# Patient Record
Sex: Female | Born: 1960 | Race: Black or African American | Hispanic: No | Marital: Married | Smoking: Never smoker
Health system: Southern US, Community
[De-identification: ages and names within clinical notes are randomized; demographics above are authoritative.]

## PROBLEM LIST (undated history)

## (undated) DIAGNOSIS — E079 Disorder of thyroid, unspecified: Secondary | ICD-10-CM

## (undated) DIAGNOSIS — I10 Essential (primary) hypertension: Secondary | ICD-10-CM

## (undated) DIAGNOSIS — T7840XA Allergy, unspecified, initial encounter: Secondary | ICD-10-CM

## (undated) HISTORY — PX: ABDOMINAL HYSTERECTOMY: SHX81

## (undated) HISTORY — DX: Allergy, unspecified, initial encounter: T78.40XA

---

## 1998-06-16 ENCOUNTER — Other Ambulatory Visit: Admission: RE | Admit: 1998-06-16 | Discharge: 1998-06-16 | Payer: Self-pay | Admitting: Gynecology

## 2000-01-03 ENCOUNTER — Other Ambulatory Visit: Admission: RE | Admit: 2000-01-03 | Discharge: 2000-01-03 | Payer: Self-pay | Admitting: Gynecology

## 2001-06-25 ENCOUNTER — Other Ambulatory Visit: Admission: RE | Admit: 2001-06-25 | Discharge: 2001-06-25 | Payer: Self-pay | Admitting: Gynecology

## 2002-08-20 ENCOUNTER — Other Ambulatory Visit: Admission: RE | Admit: 2002-08-20 | Discharge: 2002-08-20 | Payer: Self-pay | Admitting: Gynecology

## 2002-09-16 ENCOUNTER — Inpatient Hospital Stay (HOSPITAL_COMMUNITY): Admission: RE | Admit: 2002-09-16 | Discharge: 2002-09-18 | Payer: Self-pay | Admitting: Gynecology

## 2002-10-14 ENCOUNTER — Encounter: Payer: Self-pay | Admitting: Obstetrics and Gynecology

## 2002-10-14 ENCOUNTER — Inpatient Hospital Stay (HOSPITAL_COMMUNITY): Admission: AD | Admit: 2002-10-14 | Discharge: 2002-10-20 | Payer: Self-pay | Admitting: Gynecology

## 2002-10-17 ENCOUNTER — Encounter: Payer: Self-pay | Admitting: Gynecology

## 2014-12-07 ENCOUNTER — Emergency Department (HOSPITAL_COMMUNITY): Payer: No Typology Code available for payment source

## 2014-12-07 ENCOUNTER — Inpatient Hospital Stay (HOSPITAL_COMMUNITY): Payer: No Typology Code available for payment source

## 2014-12-07 ENCOUNTER — Encounter (HOSPITAL_COMMUNITY): Payer: Self-pay | Admitting: Emergency Medicine

## 2014-12-07 ENCOUNTER — Inpatient Hospital Stay (HOSPITAL_COMMUNITY)
Admission: EM | Admit: 2014-12-07 | Discharge: 2014-12-08 | DRG: 103 | Disposition: A | Discharge status: Home / Self Care | Payer: No Typology Code available for payment source | Attending: Internal Medicine | Admitting: Internal Medicine

## 2014-12-07 DIAGNOSIS — Z888 Allergy status to other drugs, medicaments and biological substances status: Secondary | ICD-10-CM

## 2014-12-07 DIAGNOSIS — R519 Headache, unspecified: Secondary | ICD-10-CM | POA: Diagnosis present

## 2014-12-07 DIAGNOSIS — R9431 Abnormal electrocardiogram [ECG] [EKG]: Secondary | ICD-10-CM

## 2014-12-07 DIAGNOSIS — R55 Syncope and collapse: Secondary | ICD-10-CM | POA: Diagnosis not present

## 2014-12-07 DIAGNOSIS — R51 Headache: Secondary | ICD-10-CM | POA: Diagnosis not present

## 2014-12-07 DIAGNOSIS — Q248 Other specified congenital malformations of heart: Secondary | ICD-10-CM

## 2014-12-07 DIAGNOSIS — Z7982 Long term (current) use of aspirin: Secondary | ICD-10-CM

## 2014-12-07 DIAGNOSIS — E039 Hypothyroidism, unspecified: Secondary | ICD-10-CM | POA: Diagnosis present

## 2014-12-07 DIAGNOSIS — I10 Essential (primary) hypertension: Secondary | ICD-10-CM | POA: Diagnosis present

## 2014-12-07 DIAGNOSIS — I498 Other specified cardiac arrhythmias: Secondary | ICD-10-CM

## 2014-12-07 HISTORY — DX: Disorder of thyroid, unspecified: E07.9

## 2014-12-07 HISTORY — DX: Essential (primary) hypertension: I10

## 2014-12-07 LAB — CBC WITH DIFFERENTIAL/PLATELET
BASOS ABS: 0 10*3/uL (ref 0.0–0.1)
BASOS PCT: 0 % (ref 0–1)
EOS PCT: 4 % (ref 0–5)
Eosinophils Absolute: 0.2 10*3/uL (ref 0.0–0.7)
HEMATOCRIT: 38 % (ref 36.0–46.0)
Hemoglobin: 12.5 g/dL (ref 12.0–15.0)
Lymphocytes Relative: 34 % (ref 12–46)
Lymphs Abs: 1.9 10*3/uL (ref 0.7–4.0)
MCH: 22.7 pg — ABNORMAL LOW (ref 26.0–34.0)
MCHC: 32.9 g/dL (ref 30.0–36.0)
MCV: 69 fL — ABNORMAL LOW (ref 78.0–100.0)
MONO ABS: 0.3 10*3/uL (ref 0.1–1.0)
MONOS PCT: 6 % (ref 3–12)
Neutro Abs: 3.2 10*3/uL (ref 1.7–7.7)
Neutrophils Relative %: 56 % (ref 43–77)
Platelets: 255 10*3/uL (ref 150–400)
RBC: 5.51 MIL/uL — AB (ref 3.87–5.11)
RDW: 15 % (ref 11.5–15.5)
WBC: 5.6 10*3/uL (ref 4.0–10.5)

## 2014-12-07 LAB — I-STAT CHEM 8, ED
BUN: 14 mg/dL (ref 6–23)
CALCIUM ION: 1.18 mmol/L (ref 1.12–1.23)
CHLORIDE: 102 meq/L (ref 96–112)
CREATININE: 0.8 mg/dL (ref 0.50–1.10)
GLUCOSE: 87 mg/dL (ref 70–99)
HCT: 43 % (ref 36.0–46.0)
HEMOGLOBIN: 14.6 g/dL (ref 12.0–15.0)
Potassium: 4.2 mmol/L (ref 3.5–5.1)
Sodium: 138 mmol/L (ref 135–145)
TCO2: 24 mmol/L (ref 0–100)

## 2014-12-07 LAB — TROPONIN I

## 2014-12-07 LAB — I-STAT TROPONIN, ED: TROPONIN I, POC: 0 ng/mL (ref 0.00–0.08)

## 2014-12-07 MED ORDER — SODIUM CHLORIDE 0.9 % IV SOLN: 250.0000 mL | INTRAVENOUS | Status: DC | PRN

## 2014-12-07 MED ORDER — DIPHENHYDRAMINE HCL 50 MG/ML IJ SOLN
25.0000 mg | Freq: Once | INTRAMUSCULAR | Status: AC
  Administered 2014-12-07: 25 mg via INTRAVENOUS
  Filled 2014-12-07: qty 1

## 2014-12-07 MED ORDER — SODIUM CHLORIDE 0.9 % IJ SOLN: 3.0000 mL | Freq: Two times a day (BID) | INTRAMUSCULAR | Status: DC

## 2014-12-07 MED ORDER — AMLODIPINE BESYLATE 5 MG PO TABS
5.0000 mg | ORAL_TABLET | Freq: Every day | ORAL | Status: DC
  Administered 2014-12-07 – 2014-12-08 (×2): 5 mg via ORAL
  Filled 2014-12-07 (×2): qty 1

## 2014-12-07 MED ORDER — ALBUTEROL SULFATE (2.5 MG/3ML) 0.083% IN NEBU
2.5000 mg | INHALATION_SOLUTION | RESPIRATORY_TRACT | Status: DC | PRN
Start: 2014-12-07 — End: 2014-12-08

## 2014-12-07 MED ORDER — SODIUM CHLORIDE 0.9 % IJ SOLN
3.0000 mL | INTRAMUSCULAR | Status: DC | PRN
Start: 2014-12-07 — End: 2014-12-08

## 2014-12-07 MED ORDER — OXYCODONE HCL 5 MG PO TABS: 5.0000 mg | ORAL_TABLET | ORAL | Status: DC | PRN

## 2014-12-07 MED ORDER — DIPHENHYDRAMINE HCL 50 MG/ML IJ SOLN: 25.0000 mg | Freq: Three times a day (TID) | INTRAMUSCULAR | Status: DC | PRN

## 2014-12-07 MED ORDER — KETOROLAC TROMETHAMINE 30 MG/ML IJ SOLN
30.0000 mg | Freq: Three times a day (TID) | INTRAMUSCULAR | Status: DC | PRN
  Filled 2014-12-07: qty 1

## 2014-12-07 MED ORDER — ONDANSETRON HCL 4 MG/2ML IJ SOLN: 4.0000 mg | Freq: Four times a day (QID) | INTRAMUSCULAR | Status: DC | PRN

## 2014-12-07 MED ORDER — METOCLOPRAMIDE HCL 5 MG/ML IJ SOLN
10.0000 mg | Freq: Once | INTRAMUSCULAR | Status: AC
  Administered 2014-12-07: 10 mg via INTRAVENOUS
  Filled 2014-12-07: qty 2

## 2014-12-07 MED ORDER — ONDANSETRON HCL 4 MG PO TABS: 4.0000 mg | ORAL_TABLET | Freq: Four times a day (QID) | ORAL | Status: DC | PRN

## 2014-12-07 MED ORDER — LEVOTHYROXINE SODIUM 50 MCG PO TABS
50.0000 ug | ORAL_TABLET | Freq: Every day | ORAL | Status: DC
  Administered 2014-12-08: 50 ug via ORAL
  Filled 2014-12-07 (×2): qty 1

## 2014-12-07 MED ORDER — HYDROCHLOROTHIAZIDE 12.5 MG PO CAPS
12.5000 mg | ORAL_CAPSULE | Freq: Every day | ORAL | Status: DC
  Administered 2014-12-08: 12.5 mg via ORAL
  Filled 2014-12-07: qty 1

## 2014-12-07 MED ORDER — CYCLOBENZAPRINE HCL 10 MG PO TABS: 5.0000 mg | ORAL_TABLET | Freq: Three times a day (TID) | ORAL | Status: DC | PRN

## 2014-12-07 MED ORDER — ACETAMINOPHEN 650 MG RE SUPP: 650.0000 mg | Freq: Four times a day (QID) | RECTAL | Status: DC | PRN

## 2014-12-07 MED ORDER — GUAIFENESIN-DM 100-10 MG/5ML PO SYRP
5.0000 mL | ORAL_SOLUTION | ORAL | Status: DC | PRN
Start: 2014-12-07 — End: 2014-12-08

## 2014-12-07 MED ORDER — METOCLOPRAMIDE HCL 5 MG/ML IJ SOLN
10.0000 mg | Freq: Three times a day (TID) | INTRAMUSCULAR | Status: DC | PRN
  Filled 2014-12-07: qty 2

## 2014-12-07 MED ORDER — SODIUM CHLORIDE 0.9 % IJ SOLN
3.0000 mL | Freq: Two times a day (BID) | INTRAMUSCULAR | Status: DC
  Administered 2014-12-07 – 2014-12-08 (×2): 3 mL via INTRAVENOUS

## 2014-12-07 MED ORDER — ACETAMINOPHEN 325 MG PO TABS: 650.0000 mg | ORAL_TABLET | Freq: Four times a day (QID) | ORAL | Status: DC | PRN

## 2014-12-07 MED ORDER — MORPHINE SULFATE 2 MG/ML IJ SOLN: 1.0000 mg | INTRAMUSCULAR | Status: DC | PRN

## 2014-12-07 MED ORDER — ENOXAPARIN SODIUM 40 MG/0.4ML ~~LOC~~ SOLN
40.0000 mg | SUBCUTANEOUS | Status: DC
  Administered 2014-12-07: 40 mg via SUBCUTANEOUS
  Filled 2014-12-07 (×2): qty 0.4
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
## 2014-12-07 NOTE — ED Notes (Signed)
Per EMS, pt coming in today to be evaluated for near syncope at church today. Pt reports that she has had a headache since 2am. Pt received 324 of aspirin in route. Pts EKG is reading septal STEMI for EMS, EMS spoke with Dr. Tanna Savoy in route about changes, No Code stemi called at that time.
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
## 2014-12-07 NOTE — ED Notes (Signed)
Pt currently in CT.

## 2014-12-08 DIAGNOSIS — I517 Cardiomegaly: Secondary | ICD-10-CM

## 2014-12-08 LAB — BASIC METABOLIC PANEL
ANION GAP: 11 (ref 5–15)
BUN: 15 mg/dL (ref 6–23)
CHLORIDE: 102 meq/L (ref 96–112)
CO2: 23 mmol/L (ref 19–32)
CREATININE: 0.86 mg/dL (ref 0.50–1.10)
Calcium: 9.9 mg/dL (ref 8.4–10.5)
GFR calc Af Amer: 88 mL/min — ABNORMAL LOW (ref >90)
GFR calc non Af Amer: 76 mL/min — ABNORMAL LOW (ref >90)
Glucose, Bld: 108 mg/dL — ABNORMAL HIGH (ref 70–99)
POTASSIUM: 3.7 mmol/L (ref 3.5–5.1)
Sodium: 136 mmol/L (ref 135–145)

## 2014-12-08 LAB — CBC
HCT: 36.7 % (ref 36.0–46.0)
Hemoglobin: 12 g/dL (ref 12.0–15.0)
MCH: 22.6 pg — ABNORMAL LOW (ref 26.0–34.0)
MCHC: 32.7 g/dL (ref 30.0–36.0)
MCV: 69.2 fL — AB (ref 78.0–100.0)
Platelets: 262 10*3/uL (ref 150–400)
RBC: 5.3 MIL/uL — AB (ref 3.87–5.11)
RDW: 14.9 % (ref 11.5–15.5)
WBC: 6.9 10*3/uL (ref 4.0–10.5)

## 2014-12-08 LAB — TROPONIN I: Troponin I: <0.03 ng/mL (ref <0.031)

## 2014-12-08 MED ORDER — FLUTICASONE PROPIONATE 50 MCG/ACT NA SUSP: 2.0000 | Freq: Every day | NASAL | Status: DC

## 2014-12-08 NOTE — Progress Notes (Signed)
Subjective: HA resolved Objective: Current vital signs: BP 136/86 mmHg  Pulse 89  Temp(Src) 98 F (36.7 C) (Oral)  Resp 20  Ht 4\' 11"  (1.499 m)  Wt 70 kg (154 lb 5.2 oz)  BMI 31.15 kg/m2  SpO2 99% Vital signs in last 24 hours: Temp:  [98 F (36.7 C)-98.4 F (36.9 C)] 98 F (36.7 C) (01/04 0619) Pulse Rate:  [68-93] 89 (01/04 0944) Resp:  [17-22] 20 (01/04 0619) BP: (116-150)/(74-105) 136/86 mmHg (01/04 0944) SpO2:  [98 %-100 %] 99 % (01/04 0619) Weight:  [70 kg (154 lb 5.2 oz)-71.215 kg (157 lb)] 70 kg (154 lb 5.2 oz) (01/03 1738)  Intake/Output from previous day:   Intake/Output this shift:   Nutritional status:    Neurologic Exam: General: NAD Mental Status: Alert, oriented, thought content appropriate.  Speech fluent without evidence of aphasia.  Able to follow 3 step commands without difficulty. Cranial Nerves: II: Discs flat bilaterally; Visual fields grossly normal, pupils equal, round, reactive to light and accommodation III,IV, VI: ptosis not present, extra-ocular motions intact bilaterally V,VII: smile symmetric, facial light touch sensation normal bilaterally VIII: hearing normal bilaterally IX,X: gag reflex present XI: bilateral shoulder shrug XII: midline tongue extension without atrophy or fasciculations  Motor: Right : Upper extremity   5/5    Left:     Upper extremity   5/5  Lower extremity   5/5     Lower extremity   5/5 Tone and bulk:normal tone throughout; no atrophy noted Sensory: Pinprick and light touch intact throughout, bilaterally Deep Tendon Reflexes:  Right: Upper Extremity   Left: Upper extremity   biceps (C-5 to C-6) 2/4   biceps (C-5 to C-6) 2/4 tricep (C7) 2/4    triceps (C7) 2/4 Brachioradialis (C6) 2/4  Brachioradialis (C6) 2/4  Lower Extremity Lower Extremity  quadriceps (L-2 to L-4) 2/4   quadriceps (L-2 to L-4) 2/4 Achilles (S1) 1/4   Achilles (S1) 1/4  Plantars: Right: downgoing   Left: downgoing t    Lab  Results: Basic Metabolic Panel:  Recent Labs Lab 12/07/14 1522 12/08/14 0135  NA 138 136  K 4.2 3.7  CL 102 102  CO2  --  23  GLUCOSE 87 108*  BUN 14 15  CREATININE 0.80 0.86  CALCIUM  --  9.9    Liver Function Tests: No results for input(s): AST, ALT, ALKPHOS, BILITOT, PROT, ALBUMIN in the last 168 hours. No results for input(s): LIPASE, AMYLASE in the last 168 hours. No results for input(s): AMMONIA in the last 168 hours.  CBC:  Recent Labs Lab 12/07/14 1500 12/07/14 1522 12/08/14 0135  WBC 5.6  --  6.9  NEUTROABS 3.2  --   --   HGB 12.5 14.6 12.0  HCT 38.0 43.0 36.7  MCV 69.0*  --  69.2*  PLT 255  --  262    Cardiac Enzymes:  Recent Labs Lab 12/07/14 2040 12/08/14 0135 12/08/14 0850  TROPONINI <0.03 <0.03 <0.03    Lipid Panel: No results for input(s): CHOL, TRIG, HDL, CHOLHDL, VLDL, LDLCALC in the last 168 hours.  CBG: No results for input(s): GLUCAP in the last 168 hours.  Microbiology: No results found for this or any previous visit.  Coagulation Studies: No results for input(s): LABPROT, INR in the last 72 hours.  Imaging: Dg Chest 2 View  12/07/2014   CLINICAL DATA:  Single episode of near syncope earlier today wall at church. Headache which began approximately 14 hr ago, now with numbness in  both feet and tingling in both arms. Current history of hypertension.  EXAM: CHEST  2 VIEW  COMPARISON:  None.  FINDINGS: Cardiomediastinal silhouette unremarkable. Lungs clear. Bronchovascular markings normal. Pulmonary vascularity normal. No visible pleural effusions. No pneumothorax. Mild degenerative changes involving the thoracic spine.  IMPRESSION: No acute cardiopulmonary disease.   Electronically Signed   By: Hulan Saas M.D.   On: 12/07/2014 16:05   Ct Head Wo Contrast  12/07/2014   CLINICAL DATA:  Headache and dizziness.  EXAM: CT HEAD WITHOUT CONTRAST  TECHNIQUE: Contiguous axial images were obtained from the base of the skull through the  vertex without intravenous contrast.  COMPARISON:  None.  FINDINGS: No mass lesion. No midline shift. No acute hemorrhage or hematoma. No extra-axial fluid collections. No evidence of acute infarction. Brain parenchyma is normal. Osseous structures are normal.  IMPRESSION: Normal exam.   Electronically Signed   By: Geanie Cooley M.D.   On: 12/07/2014 15:15   Mr Maxine Glenn Head Wo Contrast  12/07/2014   CLINICAL DATA:  Severe incapacitating Ing and throughout and frontal area headaches with mild nausea but no vomiting. Sensitivity to light.  EXAM: MRI HEAD WITHOUT CONTRAST  MRA HEAD WITHOUT CONTRAST  MRA NECK WITHOUT CONTRAST  TECHNIQUE: Multiplanar, multiecho pulse sequences of the brain and surrounding structures were obtained without intravenous contrast. Angiographic images of the Circle of Willis were obtained using MRA technique without intravenous contrast. Angiographic images of the neck were obtained using MRA technique without intravenous contrast. Carotid stenosis measurements (when applicable) are obtained utilizing NASCET criteria, using the distal internal carotid diameter as the denominator.  COMPARISON:  CT head from the same day.  FINDINGS: MRI HEAD FINDINGS  The diffusion-weighted images demonstrate no evidence for acute or subacute infarction. No acute hemorrhage or mass lesion is present. The cerebellar tonsils extend 4 mm below the foramen magnum. Posterior fossa structures are otherwise within normal limits. Midline structures are normal otherwise as well. There is no significant white matter disease.  Flow is present in the major intracranial arteries. Globes and orbits are intact. The paranasal sinuses scratch the mild mucosal thickening is present in the maxillary sinuses and ethmoid air cells. The remaining paranasal sinuses and the mastoid air cells are clear.  MRA HEAD FINDINGS  Internal carotid arteries are within normal limits from the high cervical segments. The A1 and M1 segments are normal.  The anterior communicating artery is patent. ACA and MCA branch vessels are within normal limits.  The left vertebral artery is the dominant vessel. The PICA origins are visualized and normal. The basilar artery is within normal limits. Both posterior cerebral arteries originate from the basilar tip.  MRA NECK FINDINGS  Time-of-flight images demonstrate a standard scratch the time-of-flight images demonstrated 3 vessel arch configuration. The common carotid arteries and bifurcations are normal bilaterally. The internal carotid arteries are normal bilaterally as well.  Both vertebral arteries originate from the subclavian arteries. There is no significant stenosis or flow disturbance within the neck.  IMPRESSION: 1. 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 2. MRI the brain is otherwise normal. 3. Normal variant MRA circle of Willis without evidence for significant proximal stenosis, aneurysm, or branch vessel occlusion. 4. Negative MRA of the neck. 5. Mild bilateral maxillary and ethmoid sinus disease, most significant in the left maxillary sinus.   Electronically Signed   By: Gennette Pac M.D.   On: 12/07/2014 20:30   Mr Angiogram Neck Wo Contrast  12/07/2014  CLINICAL DATA:  Severe incapacitating Ing and throughout and frontal area headaches with mild nausea but no vomiting. Sensitivity to light.  EXAM: MRI HEAD WITHOUT CONTRAST  MRA HEAD WITHOUT CONTRAST  MRA NECK WITHOUT CONTRAST  TECHNIQUE: Multiplanar, multiecho pulse sequences of the brain and surrounding structures were obtained without intravenous contrast. Angiographic images of the Circle of Willis were obtained using MRA technique without intravenous contrast. Angiographic images of the neck were obtained using MRA technique without intravenous contrast. Carotid stenosis measurements (when applicable) are obtained utilizing NASCET criteria, using the distal internal carotid diameter as the denominator.   COMPARISON:  CT head from the same day.  FINDINGS: MRI HEAD FINDINGS  The diffusion-weighted images demonstrate no evidence for acute or subacute infarction. No acute hemorrhage or mass lesion is present. The cerebellar tonsils extend 4 mm below the foramen magnum. Posterior fossa structures are otherwise within normal limits. Midline structures are normal otherwise as well. There is no significant white matter disease.  Flow is present in the major intracranial arteries. Globes and orbits are intact. The paranasal sinuses scratch the mild mucosal thickening is present in the maxillary sinuses and ethmoid air cells. The remaining paranasal sinuses and the mastoid air cells are clear.  MRA HEAD FINDINGS  Internal carotid arteries are within normal limits from the high cervical segments. The A1 and M1 segments are normal. The anterior communicating artery is patent. ACA and MCA branch vessels are within normal limits.  The left vertebral artery is the dominant vessel. The PICA origins are visualized and normal. The basilar artery is within normal limits. Both posterior cerebral arteries originate from the basilar tip.  MRA NECK FINDINGS  Time-of-flight images demonstrate a standard scratch the time-of-flight images demonstrated 3 vessel arch configuration. The common carotid arteries and bifurcations are normal bilaterally. The internal carotid arteries are normal bilaterally as well.  Both vertebral arteries originate from the subclavian arteries. There is no significant stenosis or flow disturbance within the neck.  IMPRESSION: 1. 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 2. MRI the brain is otherwise normal. 3. Normal variant MRA circle of Willis without evidence for significant proximal stenosis, aneurysm, or branch vessel occlusion. 4. Negative MRA of the neck. 5. Mild bilateral maxillary and ethmoid sinus disease, most significant in the left maxillary sinus.   Electronically  Signed   By: Gennette Pac M.D.   On: 12/07/2014 20:30   Mr Brain Wo Contrast  12/07/2014   CLINICAL DATA:  Severe incapacitating Ing and throughout and frontal area headaches with mild nausea but no vomiting. Sensitivity to light.  EXAM: MRI HEAD WITHOUT CONTRAST  MRA HEAD WITHOUT CONTRAST  MRA NECK WITHOUT CONTRAST  TECHNIQUE: Multiplanar, multiecho pulse sequences of the brain and surrounding structures were obtained without intravenous contrast. Angiographic images of the Circle of Willis were obtained using MRA technique without intravenous contrast. Angiographic images of the neck were obtained using MRA technique without intravenous contrast. Carotid stenosis measurements (when applicable) are obtained utilizing NASCET criteria, using the distal internal carotid diameter as the denominator.  COMPARISON:  CT head from the same day.  FINDINGS: MRI HEAD FINDINGS  The diffusion-weighted images demonstrate no evidence for acute or subacute infarction. No acute hemorrhage or mass lesion is present. The cerebellar tonsils extend 4 mm below the foramen magnum. Posterior fossa structures are otherwise within normal limits. Midline structures are normal otherwise as well. There is no significant white matter disease.  Flow is present in the major intracranial  arteries. Globes and orbits are intact. The paranasal sinuses scratch the mild mucosal thickening is present in the maxillary sinuses and ethmoid air cells. The remaining paranasal sinuses and the mastoid air cells are clear.  MRA HEAD FINDINGS  Internal carotid arteries are within normal limits from the high cervical segments. The A1 and M1 segments are normal. The anterior communicating artery is patent. ACA and MCA branch vessels are within normal limits.  The left vertebral artery is the dominant vessel. The PICA origins are visualized and normal. The basilar artery is within normal limits. Both posterior cerebral arteries originate from the basilar tip.   MRA NECK FINDINGS  Time-of-flight images demonstrate a standard scratch the time-of-flight images demonstrated 3 vessel arch configuration. The common carotid arteries and bifurcations are normal bilaterally. The internal carotid arteries are normal bilaterally as well.  Both vertebral arteries originate from the subclavian arteries. There is no significant stenosis or flow disturbance within the neck.  IMPRESSION: 1. 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 2. MRI the brain is otherwise normal. 3. Normal variant MRA circle of Willis without evidence for significant proximal stenosis, aneurysm, or branch vessel occlusion. 4. Negative MRA of the neck. 5. Mild bilateral maxillary and ethmoid sinus disease, most significant in the left maxillary sinus.   Electronically Signed   By: Gennette Pac M.D.   On: 12/07/2014 20:30    Medications:  Scheduled: . amLODipine  5 mg Oral Daily  . enoxaparin (LOVENOX) injection  40 mg Subcutaneous Q24H  . hydrochlorothiazide  12.5 mg Oral Daily  . levothyroxine  50 mcg Oral QAC breakfast  . sodium chloride  3 mL Intravenous Q12H  . sodium chloride  3 mL Intravenous Q12H    Assessment/Plan: HA: Now fully resolved.  MRI/MRA head and neck negative. At this time no further recommendations. Neurology will S/O     Felicie Morn PA-C [facility] [phone]  12/08/2014, 10:04 AM

## 2014-12-08 NOTE — Progress Notes (Signed)
Pt/family given discharge instructions, medication lists, follow up appointments, and when to call the doctor.  Pt/family verbalizes understanding.Pt to follow up with cardiology for event monitor. Thomas Hoff, RN

## 2014-12-08 NOTE — Progress Notes (Signed)
    Subjective:  Denies CP or dyspnea; HA improved   Objective:  Filed Vitals:   12/07/14 2001 12/07/14 2007 12/07/14 2020 12/08/14 0619  BP: 118/74 116/81 132/99 121/77  Pulse: 85 79 93 87  Temp: 98.4 F (36.9 C)   98 F (36.7 C)  TempSrc: Oral   Oral  Resp: Height:      Weight:      SpO2: 98%   99%    Intake/Output from previous day: No intake or output data in the 24 hours ending 12/08/14 0811  Physical Exam: Physical exam: Well-developed well-nourished in no acute distress.  Skin is warm and dry.  HEENT is normal.  Neck is supple.  Chest is clear to auscultation with normal expansion.  Cardiovascular exam is regular rate and rhythm.  Abdominal exam nontender or distended. No masses palpated. Extremities show no edema. neuro grossly intact    Lab Results: Basic Metabolic Panel:  Recent Labs  40/98/11 1522 12/08/14 0135  NA 138 136  K 4.2 3.7  CL 102 102  CO2  --  23  GLUCOSE 87 108*  BUN 14 15  CREATININE 0.80 0.86  CALCIUM  --  9.9   CBC:  Recent Labs  12/07/14 1500 12/07/14 1522 12/08/14 0135  WBC 5.6  --  6.9  NEUTROABS 3.2  --   --   HGB 12.5 14.6 12.0  HCT 38.0 43.0 36.7  MCV 69.0*  --  69.2*  PLT 255  --  262   Cardiac Enzymes:  Recent Labs  12/07/14 2040 12/08/14 0135  TROPONINI <0.03 <0.03     Assessment/Plan:  1 near syncope-patient states this morning she predominantly had a headache yesterday. Question mild dizziness. She did not have syncope or palpitations. Her electrocardiogram does suggest possible Brugada syndrome. However she has never had syncope, palpitations and there is no family history of arrhythmia or sudden death. I therefore do not think she requires further inpatient evaluation. Would arrange outpatient event monitor and then follow-up with Dr. Elease Hashimoto. Note I did review her electrocardiogram and case with Dr. Johney Frame. 2 hypertension-patient's blood pressure has improved. Continue present  medications. 3 HA-per IM We will sign off. Please call with questions.  Olga Millers 12/08/2014, 8:11 AM

## 2014-12-08 NOTE — Discharge Instructions (Signed)
Follow with Primary MD Shelle Iron, MD in 7 days  Please follow with cardiology as an outpatient. Get CBC, CMP, 2 view Chest X ray checked  by Primary MD next visit.    Activity: As tolerated with Full fall precautions use walker/cane & assistance as needed   Disposition Home    Diet: Regular diet , with feeding assistance and aspiration precautions as needed.  For Heart failure patients - Check your Weight same time everyday, if you gain over 2 pounds, or you develop in leg swelling, experience more shortness of breath or chest pain, call your Primary MD immediately. Follow Cardiac Low Salt Diet and 1.8 lit/day fluid restriction.   On your next visit with your primary care physician please Get Medicines reviewed and adjusted.   Please request your Prim.MD to go over all Hospital Tests and Procedure/Radiological results at the follow up, please get all Hospital records sent to your Prim MD by signing hospital release before you go home.   If you experience worsening of your admission symptoms, develop shortness of breath, life threatening emergency, suicidal or homicidal thoughts you must seek medical attention immediately by calling 911 or calling your MD immediately  if symptoms less severe.  You Must read complete instructions/literature along with all the possible adverse reactions/side effects for all the Medicines you take and that have been prescribed to you. Take any new Medicines after you have completely understood and accpet all the possible adverse reactions/side effects.   Do not drive, operating heavy machinery, perform activities at heights, swimming or participation in water activities or provide baby sitting services if your were admitted for syncope or siezures until you have seen by Primary MD or a Neurologist and advised to do so again.  Do not drive when taking Pain medications.    Do not take more than prescribed Pain, Sleep and Anxiety Medications  Special  Instructions: If you have smoked or chewed Tobacco  in the last 2 yrs please stop smoking, stop any regular Alcohol  and or any Recreational drug use.  Wear Seat belts while driving.   Please note  You were cared for by a hospitalist during your hospital stay. If you have any questions about your discharge medications or the care you received while you were in the hospital after you are discharged, you can call the unit and asked to speak with the hospitalist on call if the hospitalist that took care of you is not available. Once you are discharged, your primary care physician will handle any further medical issues. Please note that NO REFILLS for any discharge medications will be authorized once you are discharged, as it is imperative that you return to your primary care physician (or establish a relationship with a primary care physician if you do not have one) for your aftercare needs so that they can reassess your need for medications and monitor your lab values.
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
## 2014-12-08 NOTE — Progress Notes (Signed)
  Echocardiogram 2D Echocardiogram has been performed.  Sandra Harrison 12/08/2014, 4:27 PM

## 2016-07-25 ENCOUNTER — Other Ambulatory Visit: Payer: Self-pay | Admitting: Obstetrics & Gynecology

## 2016-07-25 DIAGNOSIS — R928 Other abnormal and inconclusive findings on diagnostic imaging of breast: Secondary | ICD-10-CM

## 2016-07-26 ENCOUNTER — Other Ambulatory Visit: Payer: Self-pay | Admitting: Obstetrics & Gynecology

## 2016-07-26 DIAGNOSIS — R928 Other abnormal and inconclusive findings on diagnostic imaging of breast: Secondary | ICD-10-CM

## 2016-08-09 ENCOUNTER — Ambulatory Visit
Admission: RE | Admit: 2016-08-09 | Discharge: 2016-08-09 | Disposition: A | Discharge status: Home / Self Care | Payer: Self-pay | Source: Ambulatory Visit | Attending: Obstetrics & Gynecology | Admitting: Obstetrics & Gynecology

## 2016-08-09 DIAGNOSIS — R928 Other abnormal and inconclusive findings on diagnostic imaging of breast: Secondary | ICD-10-CM

## 2017-08-01 ENCOUNTER — Other Ambulatory Visit: Payer: Self-pay | Admitting: Obstetrics & Gynecology

## 2017-08-01 DIAGNOSIS — Z1231 Encounter for screening mammogram for malignant neoplasm of breast: Secondary | ICD-10-CM

## 2017-08-14 ENCOUNTER — Ambulatory Visit
Admission: RE | Admit: 2017-08-14 | Discharge: 2017-08-14 | Disposition: A | Discharge status: Home / Self Care | Source: Ambulatory Visit | Attending: Obstetrics & Gynecology | Admitting: Obstetrics & Gynecology

## 2017-08-14 DIAGNOSIS — Z1231 Encounter for screening mammogram for malignant neoplasm of breast: Secondary | ICD-10-CM

## 2017-09-10 IMAGING — MG 2D DIGITAL SCREENING BILATERAL MAMMOGRAM WITH CAD AND ADJUNCT TO
8 of 12 series · 8 of 28 positions shown · non-contrast
Comparison: Previous exam(s).

CLINICAL DATA: Screening.

EXAM:
2D DIGITAL SCREENING BILATERAL MAMMOGRAM WITH CAD AND ADJUNCT TOMO

[L MLO synth-2D]
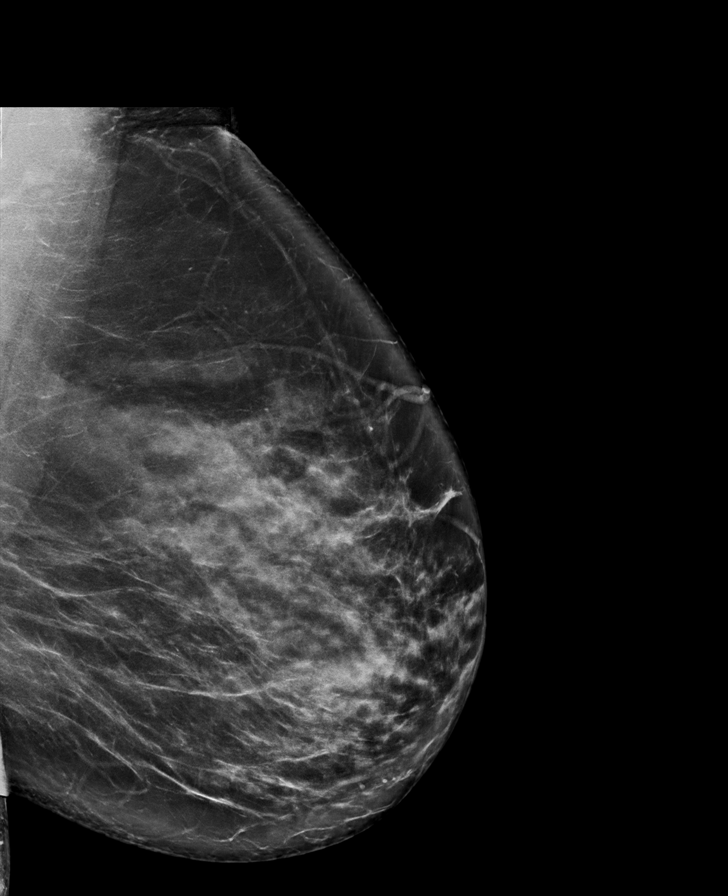

[R CC synth-2D]
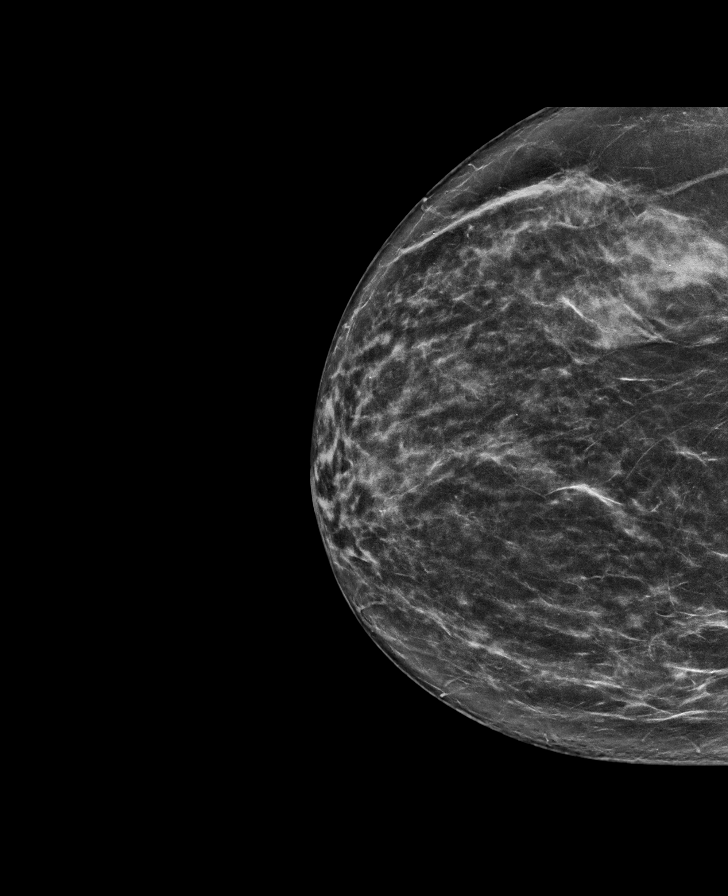

[R MLO synth-2D]
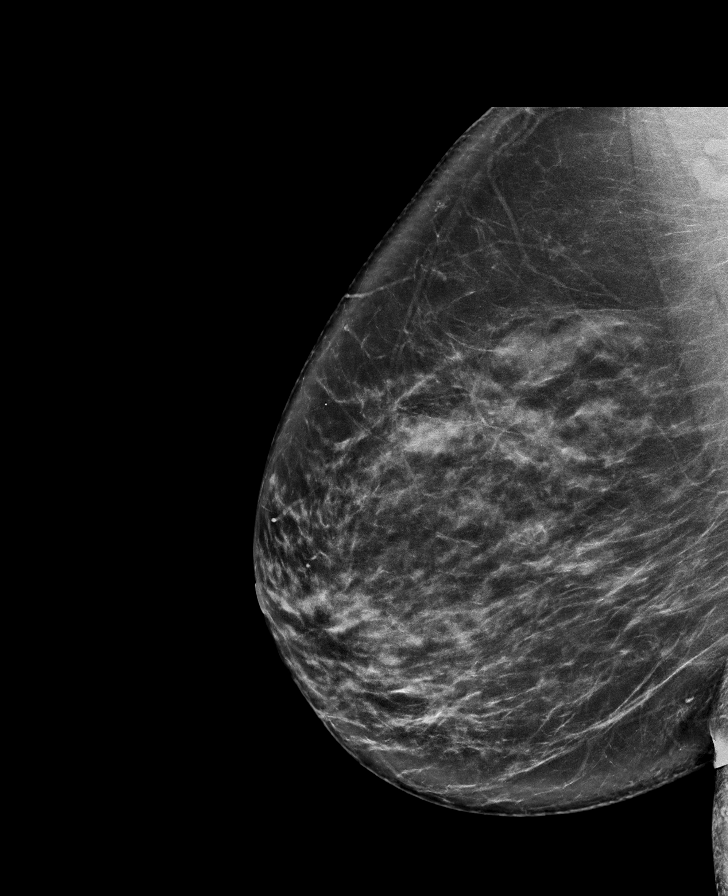

[R MLO]
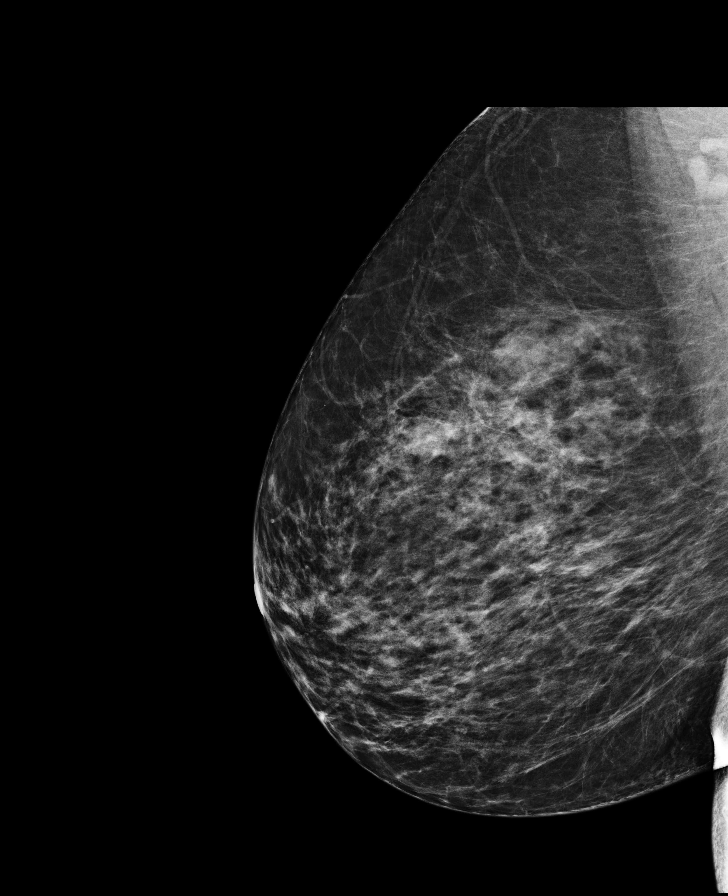

[L CC synth-2D]
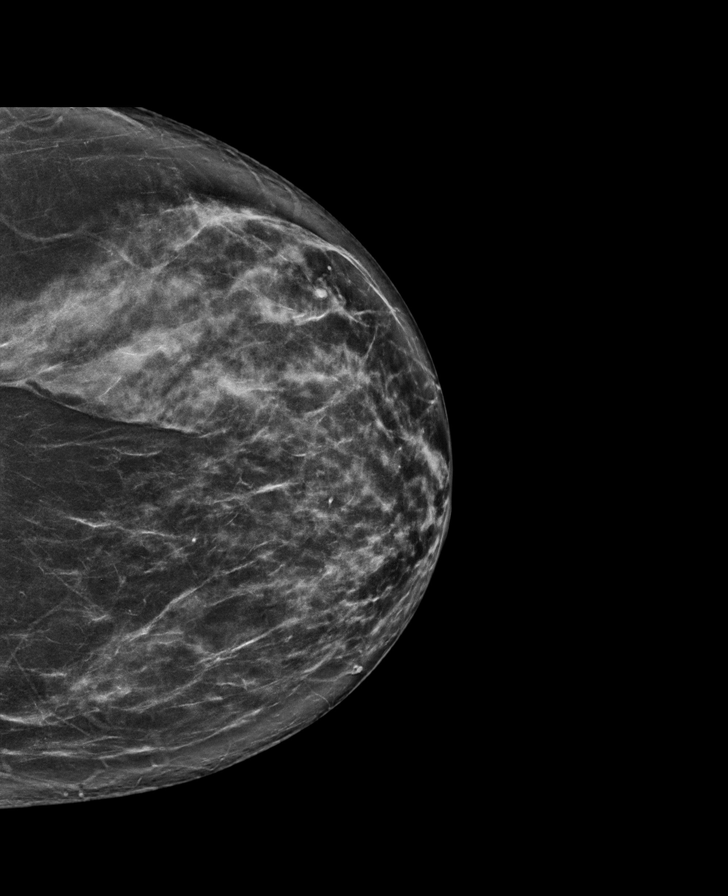

[L CC]
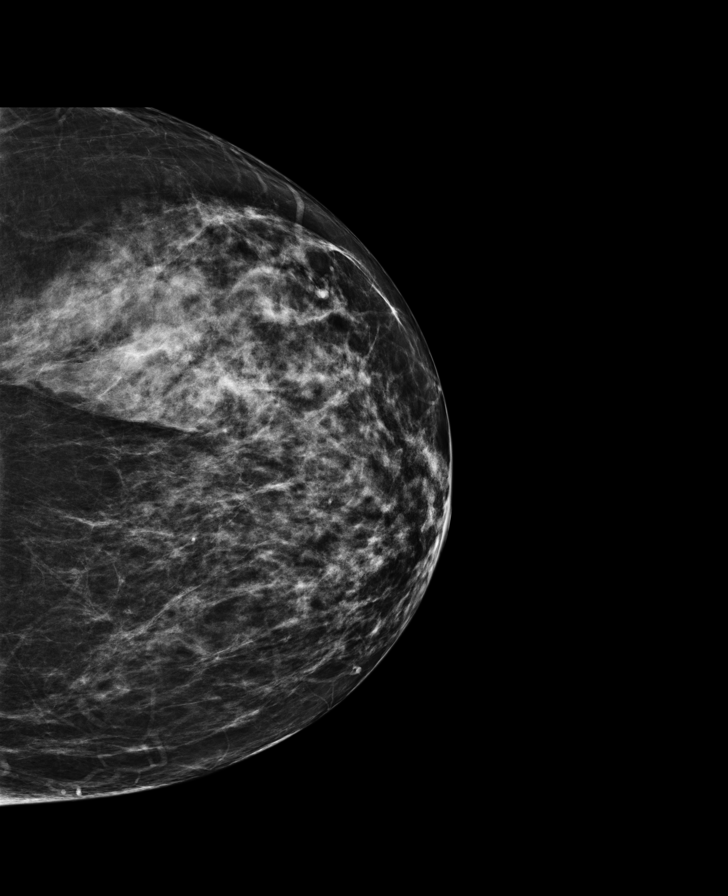

[L MLO]
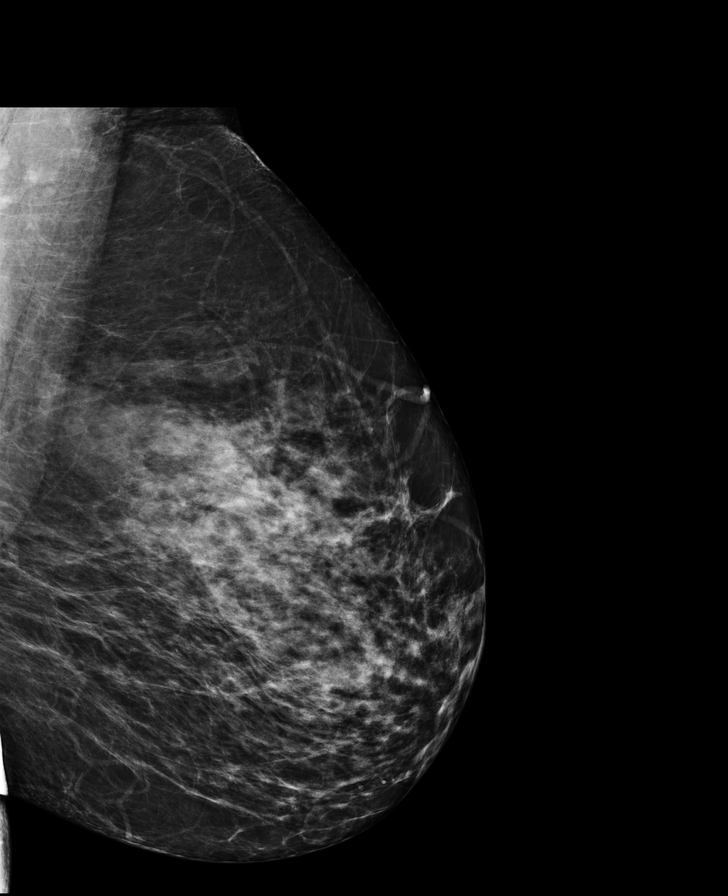

[R CC]
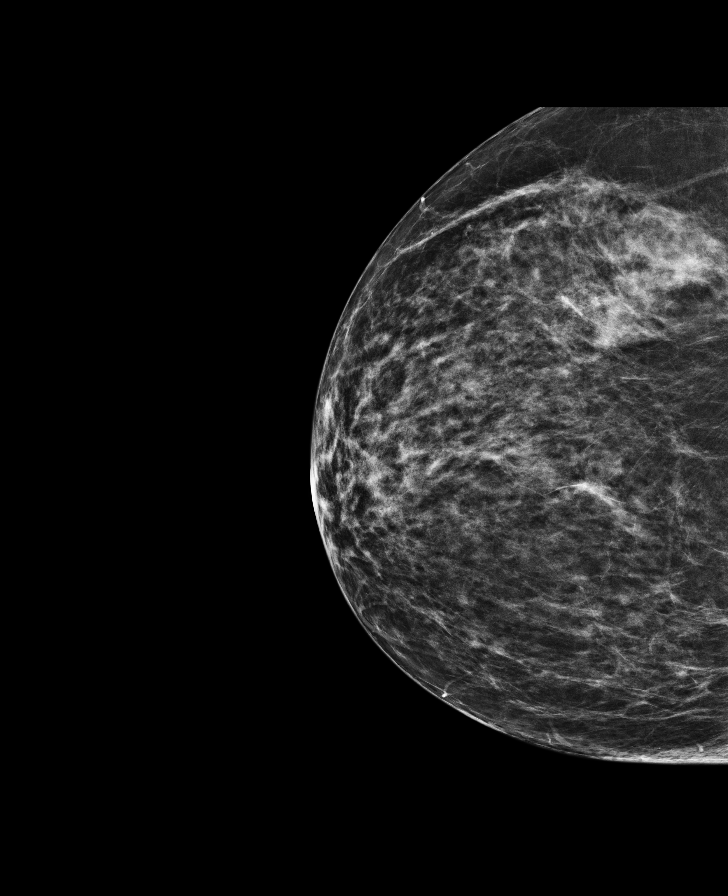

[8 of 28 positions shown; findings below may reference images not displayed]

ACR Breast Density Category c: The breast tissue is heterogeneously
dense, which may obscure small masses.
FINDINGS: There are no findings suspicious for malignancy. Images were
processed with CAD.
IMPRESSION: No mammographic evidence of malignancy. A result letter of this
screening mammogram will be mailed directly to the patient.

RECOMMENDATION:
Screening mammogram in one year. (Code:[id number])

BI-RADS CATEGORY  1: Negative.

## 2018-10-22 ENCOUNTER — Other Ambulatory Visit: Payer: Self-pay | Admitting: Family Medicine

## 2018-10-22 DIAGNOSIS — Z1231 Encounter for screening mammogram for malignant neoplasm of breast: Secondary | ICD-10-CM

## 2018-10-30 ENCOUNTER — Ambulatory Visit
Admission: RE | Admit: 2018-10-30 | Discharge: 2018-10-30 | Disposition: A | Discharge status: Home / Self Care | Source: Ambulatory Visit | Attending: Family Medicine | Admitting: Family Medicine

## 2018-10-30 DIAGNOSIS — Z1231 Encounter for screening mammogram for malignant neoplasm of breast: Secondary | ICD-10-CM

## 2019-12-12 ENCOUNTER — Other Ambulatory Visit: Payer: Self-pay | Admitting: Family Medicine

## 2019-12-12 ENCOUNTER — Other Ambulatory Visit: Payer: Self-pay | Admitting: Obstetrics and Gynecology

## 2019-12-12 DIAGNOSIS — Z1231 Encounter for screening mammogram for malignant neoplasm of breast: Secondary | ICD-10-CM

## 2019-12-31 ENCOUNTER — Other Ambulatory Visit: Payer: Self-pay | Admitting: Family Medicine

## 2019-12-31 DIAGNOSIS — Z780 Asymptomatic menopausal state: Secondary | ICD-10-CM

## 2020-01-21 ENCOUNTER — Other Ambulatory Visit: Payer: Self-pay

## 2020-01-21 ENCOUNTER — Ambulatory Visit
Admission: RE | Admit: 2020-01-21 | Discharge: 2020-01-21 | Disposition: A | Discharge status: Home / Self Care | Source: Ambulatory Visit | Attending: Family Medicine | Admitting: Family Medicine

## 2020-01-21 DIAGNOSIS — Z1231 Encounter for screening mammogram for malignant neoplasm of breast: Secondary | ICD-10-CM

## 2020-03-16 ENCOUNTER — Other Ambulatory Visit: Payer: Self-pay

## 2020-03-16 ENCOUNTER — Ambulatory Visit
Admission: RE | Admit: 2020-03-16 | Discharge: 2020-03-16 | Disposition: A | Discharge status: Home / Self Care | Source: Ambulatory Visit | Attending: Family Medicine | Admitting: Family Medicine

## 2020-03-16 DIAGNOSIS — Z780 Asymptomatic menopausal state: Secondary | ICD-10-CM

## 2020-12-30 ENCOUNTER — Other Ambulatory Visit: Payer: Self-pay | Admitting: Family Medicine

## 2020-12-30 DIAGNOSIS — Z0000 Encounter for general adult medical examination without abnormal findings: Secondary | ICD-10-CM

## 2021-02-15 ENCOUNTER — Other Ambulatory Visit: Payer: Self-pay

## 2021-02-15 ENCOUNTER — Ambulatory Visit
Admission: RE | Admit: 2021-02-15 | Discharge: 2021-02-15 | Disposition: A | Discharge status: Home / Self Care | Payer: TRICARE For Life (TFL) | Source: Ambulatory Visit | Attending: Family Medicine | Admitting: Family Medicine

## 2021-02-15 DIAGNOSIS — Z0000 Encounter for general adult medical examination without abnormal findings: Secondary | ICD-10-CM

## 2022-01-06 ENCOUNTER — Other Ambulatory Visit: Payer: Self-pay | Admitting: Family Medicine

## 2022-01-06 DIAGNOSIS — Z1231 Encounter for screening mammogram for malignant neoplasm of breast: Secondary | ICD-10-CM

## 2022-02-21 ENCOUNTER — Ambulatory Visit
Admission: RE | Admit: 2022-02-21 | Discharge: 2022-02-21 | Disposition: A | Discharge status: Home / Self Care | Source: Ambulatory Visit | Attending: Family Medicine | Admitting: Family Medicine

## 2022-02-21 DIAGNOSIS — Z1231 Encounter for screening mammogram for malignant neoplasm of breast: Secondary | ICD-10-CM

## 2023-06-09 ENCOUNTER — Encounter: Payer: Self-pay | Admitting: Gastroenterology

## 2023-06-23 ENCOUNTER — Other Ambulatory Visit: Payer: Self-pay | Admitting: Family Medicine

## 2023-06-23 DIAGNOSIS — Z1231 Encounter for screening mammogram for malignant neoplasm of breast: Secondary | ICD-10-CM

## 2023-06-27 ENCOUNTER — Ambulatory Visit
Admission: RE | Admit: 2023-06-27 | Discharge: 2023-06-27 | Disposition: A | Discharge status: Home / Self Care | Source: Ambulatory Visit | Attending: Family Medicine | Admitting: Family Medicine

## 2023-06-27 DIAGNOSIS — Z1231 Encounter for screening mammogram for malignant neoplasm of breast: Secondary | ICD-10-CM

## 2023-08-11 ENCOUNTER — Telehealth: Payer: Self-pay | Admitting: *Deleted

## 2023-08-11 NOTE — Telephone Encounter (Signed)
Patient has Brugada syndrome listed under problem list, couldn't find any additional info- if confirmed she has this, I'm assuming she needs to be done at hospital ?  Thanks

## 2023-08-14 NOTE — Telephone Encounter (Signed)
DR Chales Abrahams, I attempted to reach patient to further question her about Brugada syndrome.  Per Cathlyn Parsons she will be need to be done at the hospital. Do you need her to be seen in the office first?

## 2023-08-15 NOTE — Telephone Encounter (Signed)
Inbound call from patient cancelling procedure due to starting a new job. Patient wished to schedule early sometime next year. Will callback when ready .

## 2023-08-15 NOTE — Telephone Encounter (Signed)
Called patient to discuss cancelling colonoscopy at [facility] and rescheduling at the hospital.  Unable to reach patient to cancel pre visit on 08/17/23.  Left message.

## 2023-08-17 ENCOUNTER — Encounter

## 2023-09-05 ENCOUNTER — Encounter: Admitting: Gastroenterology

## 2023-10-09 ENCOUNTER — Telehealth: Payer: Self-pay | Admitting: Gastroenterology

## 2023-10-09 NOTE — Telephone Encounter (Signed)
Inbound call from patient, states she would like to reschedule her colonoscopy, needs to be done at the hospital.

## 2023-10-10 ENCOUNTER — Other Ambulatory Visit: Payer: Self-pay

## 2023-10-10 DIAGNOSIS — I498 Other specified cardiac arrhythmias: Secondary | ICD-10-CM

## 2023-10-10 DIAGNOSIS — Z1211 Encounter for screening for malignant neoplasm of colon: Secondary | ICD-10-CM

## 2023-10-10 NOTE — Telephone Encounter (Signed)
Pt stated that she requested to reschedule her Colonoscopy to be done at the hospital.  Chart reviewed. Pt was scheduled for a previsit appointment on 12/13/2023 at 9:00 AM. Pt made aware. Location provided. Pt was scheduled for a Colonoscopy 12/31/2022 at [facility] on 12/31/2022 at 9:00 AM. Pt made aware. Case ID [id number]. Pt verbalized understanding with all questions answered.

## 2023-12-13 ENCOUNTER — Ambulatory Visit

## 2023-12-13 ENCOUNTER — Telehealth: Payer: Self-pay | Admitting: *Deleted

## 2023-12-13 MED ORDER — NA SULFATE-K SULFATE-MG SULF 17.5-3.13-1.6 GM/177ML PO SOLN: 1.0000 | Freq: Once | ORAL | 0 refills | Status: AC

## 2023-12-13 NOTE — Telephone Encounter (Signed)
 Inbound call from patient returning phone call. Patient has been rescheduled for 1/17 at 2:30 pm for phone call. Patient is requesting a call if there is time available today due to being off of work. Please advise, thank you.

## 2023-12-13 NOTE — Telephone Encounter (Signed)
 Pt was to be an in person PV. Called since no show by 10 after.Unable to reach pt. LM with call back # nd instructions to call and reschedule her PV or procedure will be canceled at end of day per protocal.

## 2023-12-22 ENCOUNTER — Ambulatory Visit (AMBULATORY_SURGERY_CENTER)

## 2023-12-22 ENCOUNTER — Other Ambulatory Visit: Payer: Self-pay

## 2023-12-22 ENCOUNTER — Encounter (HOSPITAL_COMMUNITY): Payer: Self-pay | Admitting: Gastroenterology

## 2023-12-22 VITALS — Ht 59.0 in | Wt 145.0 lb

## 2023-12-22 DIAGNOSIS — Z1211 Encounter for screening for malignant neoplasm of colon: Secondary | ICD-10-CM

## 2023-12-22 MED ORDER — NA SULFATE-K SULFATE-MG SULF 17.5-3.13-1.6 GM/177ML PO SOLN: 1.0000 | Freq: Once | ORAL | 0 refills | Status: AC

## 2023-12-22 NOTE — Progress Notes (Signed)
Denies allergies to eggs or soy products. Denies complication of anesthesia or sedation. Denies use of weight loss medication. Denies use of O2.   Emmi instructions given for colonoscopy.  

## 2024-01-01 ENCOUNTER — Ambulatory Visit (HOSPITAL_COMMUNITY)
Admission: RE | Admit: 2024-01-01 | Discharge: 2024-01-01 | Disposition: A | Discharge status: Home / Self Care | Attending: Gastroenterology | Admitting: Gastroenterology

## 2024-01-01 ENCOUNTER — Encounter (HOSPITAL_COMMUNITY)
Admission: RE | Disposition: A | Discharge status: Home / Self Care | Payer: Self-pay | Source: Home / Self Care | Attending: Gastroenterology

## 2024-01-01 ENCOUNTER — Other Ambulatory Visit: Payer: Self-pay

## 2024-01-01 ENCOUNTER — Encounter (HOSPITAL_COMMUNITY): Payer: Self-pay | Admitting: Gastroenterology

## 2024-01-01 ENCOUNTER — Ambulatory Visit (HOSPITAL_COMMUNITY): Admitting: Anesthesiology

## 2024-01-01 DIAGNOSIS — I10 Essential (primary) hypertension: Secondary | ICD-10-CM | POA: Diagnosis not present

## 2024-01-01 DIAGNOSIS — Z1211 Encounter for screening for malignant neoplasm of colon: Secondary | ICD-10-CM | POA: Diagnosis present

## 2024-01-01 DIAGNOSIS — E039 Hypothyroidism, unspecified: Secondary | ICD-10-CM | POA: Diagnosis not present

## 2024-01-01 DIAGNOSIS — K640 First degree hemorrhoids: Secondary | ICD-10-CM | POA: Diagnosis not present

## 2024-01-01 DIAGNOSIS — K5730 Diverticulosis of large intestine without perforation or abscess without bleeding: Secondary | ICD-10-CM | POA: Diagnosis not present

## 2024-01-01 DIAGNOSIS — D120 Benign neoplasm of cecum: Secondary | ICD-10-CM

## 2024-01-01 DIAGNOSIS — I498 Other specified cardiac arrhythmias: Secondary | ICD-10-CM

## 2024-01-01 HISTORY — PX: COLONOSCOPY WITH PROPOFOL: SHX5780

## 2024-01-01 HISTORY — PX: POLYPECTOMY: SHX5525

## 2024-01-01 SURGERY — COLONOSCOPY WITH PROPOFOL
Anesthesia: Monitor Anesthesia Care

## 2024-01-01 MED ORDER — ASPIRIN EC 81 MG PO TBEC: 81.0000 mg | DELAYED_RELEASE_TABLET | Freq: Four times a day (QID) | ORAL | 11 refills | Status: AC | PRN

## 2024-01-01 MED ORDER — SODIUM CHLORIDE 0.9 % IV SOLN: INTRAVENOUS | Status: DC

## 2024-01-01 MED ORDER — SODIUM CHLORIDE 0.9 % IV SOLN: INTRAVENOUS | Status: DC | PRN

## 2024-01-01 MED ORDER — LIDOCAINE HCL (CARDIAC) PF 100 MG/5ML IV SOSY
PREFILLED_SYRINGE | INTRAVENOUS | Status: DC | PRN
  Administered 2024-01-01: 80 mg via INTRATRACHEAL

## 2024-01-01 MED ORDER — PROPOFOL 10 MG/ML IV BOLUS
INTRAVENOUS | Status: DC | PRN
  Administered 2024-01-01: 60 mg via INTRAVENOUS
  Administered 2024-01-01: 30 mg via INTRAVENOUS

## 2024-01-01 MED ORDER — PROPOFOL 1000 MG/100ML IV EMUL
INTRAVENOUS | Status: AC
  Filled 2024-01-01: qty 100

## 2024-01-01 MED ORDER — PROPOFOL 500 MG/50ML IV EMUL
INTRAVENOUS | Status: DC | PRN
  Administered 2024-01-01: 100 ug/kg/min via INTRAVENOUS

## 2024-01-01 SURGICAL SUPPLY — 21 items
ELECT REM PT RETURN 9FT ADLT (ELECTROSURGICAL) IMPLANT
ELECTRODE REM PT RTRN 9FT ADLT (ELECTROSURGICAL) IMPLANT
FCP BXJMBJMB 240X2.8X (CUTTING FORCEPS)
FLOOR PAD 36X40 (MISCELLANEOUS) ×2 IMPLANT
FORCEPS BIOP RAD 4 LRG CAP 4 (CUTTING FORCEPS) IMPLANT
FORCEPS BIOP RJ4 240 W/NDL (CUTTING FORCEPS) IMPLANT
FORCEPS BXJMBJMB 240X2.8X (CUTTING FORCEPS) IMPLANT
INJECTOR/SNARE I SNARE (MISCELLANEOUS) IMPLANT
LUBRICANT JELLY 4.5OZ STERILE (MISCELLANEOUS) IMPLANT
MANIFOLD NEPTUNE II (INSTRUMENTS) IMPLANT
NDL SCLEROTHERAPY 25GX240 (NEEDLE) IMPLANT
NEEDLE SCLEROTHERAPY 25GX240 (NEEDLE) IMPLANT
PAD FLOOR 36X40 (MISCELLANEOUS) ×2 IMPLANT
PROBE APC STR FIRE (PROBE) IMPLANT
PROBE INJECTION GOLD 7FR (MISCELLANEOUS) IMPLANT
SNARE ROTATE MED OVAL 20MM (MISCELLANEOUS) IMPLANT
SYR 50ML LL SCALE MARK (SYRINGE) IMPLANT
TRAP SPECIMEN MUCOUS 40CC (MISCELLANEOUS) IMPLANT
TUBING ENDO SMARTCAP PENTAX (MISCELLANEOUS) IMPLANT
TUBING IRRIGATION ENDOGATOR (MISCELLANEOUS) ×2 IMPLANT
WATER STERILE IRR 1000ML POUR (IV SOLUTION) IMPLANT

## 2024-01-01 NOTE — Op Note (Signed)
[facility] Patient Name: Sandra Harrison Procedure Date: 01/01/2024 MRN: [id number] Attending MD: Lynann Bologna , MD, [id number] Date of Birth: 1961-10-01 CSN: [id number] Age: 63 Admit Type: Outpatient Procedure:                Colonoscopy Indications:              Screening for colorectal malignant neoplasm Providers:                Lynann Bologna, MD, Doristine Mango, RN, Alan Ripper,                            Technician Referring MD:              Medicines:                Monitored Anesthesia Care Complications:            No immediate complications. Estimated Blood Loss:     Estimated blood loss: none. Procedure:                Pre-Anesthesia Assessment:                           - Prior to the procedure, a History and Physical                            was performed, and patient medications and                            allergies were reviewed. The patient's tolerance of                            previous anesthesia was also reviewed. The risks                            and benefits of the procedure and the sedation                            options and risks were discussed with the patient.                            All questions were answered, and informed consent                            was obtained. Prior Anticoagulants: The patient has                            taken no anticoagulant or antiplatelet agents. ASA                            Grade Assessment: II - A patient with mild systemic                            disease. After reviewing the risks and benefits,  the patient was deemed in satisfactory condition to                            undergo the procedure.                           After obtaining informed consent, the colonoscope                            was passed under direct vision. Throughout the                            procedure, the patient's blood pressure, pulse, and                            oxygen  saturations were monitored continuously. The                            [id number]-HQ190L ([id number]) Olympus colonoscope was                            introduced through the anus and advanced to the 2                            cm into the ileum. The colonoscopy was performed                            without difficulty. The patient tolerated the                            procedure well. The quality of the bowel                            preparation was good. The terminal ileum, ileocecal                            valve, appendiceal orifice, and rectum were                            photographed. Scope In: 9:10:58 AM Scope Out: 9:26:18 AM Scope Withdrawal Time: 0 hours 11 minutes 2 seconds  Total Procedure Duration: 0 hours 15 minutes 20 seconds  Findings:      A 12 mm polyp was found in the cecum. The polyp was sessile. The polyp       was removed with a cold snare using a piecemeal technique. Resection and       retrieval were complete.      Multiple medium-mouthed diverticula were found in the sigmoid colon and       rare in ascending colon.      Non-bleeding internal hemorrhoids were found during retroflexion. The       hemorrhoids were small and Grade I (internal hemorrhoids that do not       prolapse).      The terminal ileum appeared normal.      The exam was otherwise without abnormality on direct and retroflexion  views. Impression:               - One 12 mm polyp in the cecum, removed with a cold                            snare. Resected and retrieved.                           - Mod predominantly sigmoid diverticulosis.                           - Non-bleeding internal hemorrhoids.                           - The examined portion of the ileum was normal.                           - The examination was otherwise normal on direct                            and retroflexion views. Moderate Sedation:      Not Applicable - Patient had care per Anesthesia. Recommendation:            - Patient has a contact number available for                            emergencies. The signs and symptoms of potential                            delayed complications were discussed with the                            patient. Return to normal activities tomorrow.                            Written discharge instructions were provided to the                            patient.                           - High fiber diet. If hard stools or constipation,                            start Benefiber 1 tablespoon p.o. daily with 8                            ounces of water every day.                           - Continue present medications.                           - Await pathology results.                           -  No aspirin, ibuprofen, naproxen, or other                            non-steroidal anti-inflammatory drugs for 5 days                            after polyp removal.                           - Repeat colonoscopy for surveillance based on                            pathology results.                           - The findings and recommendations were discussed                            with the patient's family. Procedure Code(s):        --- Professional ---                           [phone], Colonoscopy, flexible; with removal of                            tumor(s), polyp(s), or other lesion(s) by snare                            technique Diagnosis Code(s):        --- Professional ---                           Z12.11, Encounter for screening for malignant                            neoplasm of colon                           D12.0, Benign neoplasm of cecum                           K64.0, First degree hemorrhoids                           K57.30, Diverticulosis of large intestine without                            perforation or abscess without bleeding CPT copyright 2022 [employer]. All rights reserved. The codes documented in this report are  preliminary and upon coder review may  be revised to meet current compliance requirements. Lynann Bologna, MD 01/01/2024 9:38:17 AM This report has been signed electronically. Number of Addenda: 0

## 2024-01-01 NOTE — Anesthesia Preprocedure Evaluation (Signed)
Anesthesia Evaluation  Patient identified by MRN, date of birth, ID band Patient awake    Reviewed: Allergy & Precautions, NPO status , Patient's Chart, lab work & pertinent test results  Airway Mallampati: II  TM Distance: >3 FB     Dental   Pulmonary neg pulmonary ROS   Pulmonary exam normal        Cardiovascular hypertension, Pt. on medications Normal cardiovascular exam     Neuro/Psych negative neurological ROS     GI/Hepatic negative GI ROS, Neg liver ROS,,,  Endo/Other  Hypothyroidism    Renal/GU negative Renal ROS     Musculoskeletal   Abdominal   Peds  Hematology negative hematology ROS (+)   Anesthesia Other Findings   Reproductive/Obstetrics                             Anesthesia Physical Anesthesia Plan  ASA: 2  Anesthesia Plan: MAC   Post-op Pain Management:    Induction:   PONV Risk Score and Plan: 2 and Propofol infusion  Airway Management Planned: Natural Airway and Simple Face Mask  Additional Equipment:   Intra-op Plan:   Post-operative Plan:   Informed Consent: I have reviewed the patients History and Physical, chart, labs and discussed the procedure including the risks, benefits and alternatives for the proposed anesthesia with the patient or authorized representative who has indicated his/her understanding and acceptance.       Plan Discussed with: CRNA  Anesthesia Plan Comments:        Anesthesia Quick Evaluation

## 2024-01-01 NOTE — Discharge Instructions (Signed)
YOU HAD AN ENDOSCOPIC PROCEDURE TODAY: Refer to the procedure report and other information in the discharge instructions given to you for any specific questions about what was found during the examination. If this information does not answer your questions, please call [place] office at [phone] to clarify.   YOU SHOULD EXPECT: Some feelings of bloating in the abdomen. Passage of more gas than usual. Walking can help get rid of the air that was put into your GI tract during the procedure and reduce the bloating. If you had a lower endoscopy (such as a colonoscopy or flexible sigmoidoscopy) you may notice spotting of blood in your stool or on the toilet paper. Some abdominal soreness may be present for a day or two, also.  DIET: Your first meal following the procedure should be a light meal and then it is ok to progress to your normal diet. A half-sandwich or bowl of soup is an example of a good first meal. Heavy or fried foods are harder to digest and may make you feel nauseous or bloated. Drink plenty of fluids but you should avoid alcoholic beverages for 24 hours. If you had a esophageal dilation, please see attached instructions for diet.    ACTIVITY: Your care partner should take you home directly after the procedure. You should plan to take it easy, moving slowly for the rest of the day. You can resume normal activity the day after the procedure however YOU SHOULD NOT DRIVE, use power tools, machinery or perform tasks that involve climbing or major physical exertion for 24 hours (because of the sedation medicines used during the test).   SYMPTOMS TO REPORT IMMEDIATELY: A gastroenterologist can be reached at any hour. Please call [phone]  for any of the following symptoms:  Following lower endoscopy (colonoscopy, flexible sigmoidoscopy) Excessive amounts of blood in the stool  Significant tenderness, worsening of abdominal pains  Swelling of the abdomen that is new, acute  Fever of 100 or  higher  Following upper endoscopy (EGD, EUS, ERCP, esophageal dilation) Vomiting of blood or coffee ground material  New, significant abdominal pain  New, significant chest pain or pain under the shoulder blades  Painful or persistently difficult swallowing  New shortness of breath  Black, tarry-looking or red, bloody stools  FOLLOW UP:  If any biopsies were taken you will be contacted by phone or by letter within the next 1-3 weeks. Call [phone]  if you have not heard about the biopsies in 3 weeks.  Please also call with any specific questions about appointments or follow up tests.YOU HAD AN ENDOSCOPIC PROCEDURE TODAY: Refer to the procedure report and other information in the discharge instructions given to you for any specific questions about what was found during the examination. If this information does not answer your questions, please call [place] office at [phone] to clarify.   YOU SHOULD EXPECT: Some feelings of bloating in the abdomen. Passage of more gas than usual. Walking can help get rid of the air that was put into your GI tract during the procedure and reduce the bloating. If you had a lower endoscopy (such as a colonoscopy or flexible sigmoidoscopy) you may notice spotting of blood in your stool or on the toilet paper. Some abdominal soreness may be present for a day or two, also.  DIET: Your first meal following the procedure should be a light meal and then it is ok to progress to your normal diet. A half-sandwich or bowl of soup is an example of a  good first meal. Heavy or fried foods are harder to digest and may make you feel nauseous or bloated. Drink plenty of fluids but you should avoid alcoholic beverages for 24 hours. If you had a esophageal dilation, please see attached instructions for diet.    ACTIVITY: Your care partner should take you home directly after the procedure. You should plan to take it easy, moving slowly for the rest of the day. You can resume  normal activity the day after the procedure however YOU SHOULD NOT DRIVE, use power tools, machinery or perform tasks that involve climbing or major physical exertion for 24 hours (because of the sedation medicines used during the test).   SYMPTOMS TO REPORT IMMEDIATELY: A gastroenterologist can be reached at any hour. Please call [phone]  for any of the following symptoms:  Following lower endoscopy (colonoscopy, flexible sigmoidoscopy) Excessive amounts of blood in the stool  Significant tenderness, worsening of abdominal pains  Swelling of the abdomen that is new, acute  Fever of 100 or higher  Following upper endoscopy (EGD, EUS, ERCP, esophageal dilation) Vomiting of blood or coffee ground material  New, significant abdominal pain  New, significant chest pain or pain under the shoulder blades  Painful or persistently difficult swallowing  New shortness of breath  Black, tarry-looking or red, bloody stools  FOLLOW UP:  If any biopsies were taken you will be contacted by phone or by letter within the next 1-3 weeks. Call [phone]  if you have not heard about the biopsies in 3 weeks.  Please also call with any specific questions about appointments or follow up tests.

## 2024-01-01 NOTE — Transfer of Care (Signed)
Immediate Anesthesia Transfer of Care Note  Patient: Sandra Harrison  Procedure(s) Performed: COLONOSCOPY WITH PROPOFOL POLYPECTOMY  Patient Location: PACU  Anesthesia Type:MAC  Level of Consciousness: awake and alert   Airway & Oxygen Therapy: Patient Spontanous Breathing and Patient connected to face mask oxygen  Post-op Assessment: Report given to RN and Post -op Vital signs reviewed and stable  Post vital signs: Reviewed and stable  Last Vitals:  Vitals Value Taken Time  BP    Temp    Pulse 77 01/01/24 0934  Resp 17 01/01/24 0934  SpO2 100 % 01/01/24 0934  Vitals shown include unfiled device data.  Last Pain:  Vitals:   01/01/24 0739  TempSrc: Tympanic  PainSc: 0-No pain         Complications: No notable events documented.

## 2024-01-01 NOTE — H&P (Signed)
[facility] History and Physical   Primary Care Physician:  Shelle Iron, MD   Reason for Procedure:   CRC screening  Plan:    colon     HPI: Sandra Harrison is a 63 y.o. female    Past Medical History:  Diagnosis Date   Allergy    Hypertension    Thyroid disease    Hypo    Past Surgical History:  Procedure Laterality Date   ABDOMINAL HYSTERECTOMY      Prior to Admission medications   Medication Sig Start Date End Date Taking? Authorizing Provider  acetaminophen (TYLENOL) 500 MG tablet Take 500 mg by mouth every 8 (eight) hours as needed for headache.   Yes [provider]  aspirin EC 81 MG tablet Take 81 mg by mouth every 6 (six) hours as needed for moderate pain.   Yes [provider]  cyclobenzaprine (FLEXERIL) 5 MG tablet Take 5 mg by mouth 3 (three) times daily as needed for muscle spasms.   Yes [provider]  hydrochlorothiazide (MICROZIDE) 12.5 MG capsule Take 12.5 mg by mouth daily.   Yes [provider]  levothyroxine (SYNTHROID, LEVOTHROID) 50 MCG tablet Take 50 mcg by mouth daily before breakfast.   Yes [provider]  montelukast (SINGULAIR) 10 MG tablet Take 10 mg by mouth at bedtime.   Yes [provider]  OVER THE COUNTER MEDICATION Vitamin D 3, two capsules daily.   Yes [provider]  potassium chloride (KLOR-CON) 10 MEQ tablet Take 10 mEq by mouth daily.   Yes [provider]  tirzepatide (ZEPBOUND) 7.5 MG/0.5ML Pen Inject 7.5 mg into the skin once a week.   Yes [provider]  OVER THE COUNTER MEDICATION Vitamin E one capsule daily.    [provider]  OVER THE COUNTER MEDICATION Iron capsule, 65 mg, one daily.    [provider]  OVER THE COUNTER MEDICATION Vitamin B 12 one tablet daily.    [provider]  OVER THE COUNTER MEDICATION Magnesium 7, Two tablets daily.    [provider]  oxyCODONE (OXY IR/ROXICODONE) 5  MG immediate release tablet Take 5 mg by mouth every 6 (six) hours as needed for severe pain. Patient not taking: Reported on 12/22/2023    [provider]    Current Facility-Administered Medications  Medication Dose Route Frequency Provider Last Rate Last Admin   0.9 %  sodium chloride infusion   Intravenous Continuous Lynann Bologna, MD        Allergies as of 10/10/2023 - Review Complete 12/07/2014  Allergen Reaction Noted   Codeine  12/07/2014    Family History  Problem Relation Age of Onset   Breast cancer Neg Hx    Colon cancer Neg Hx    Esophageal cancer Neg Hx    Rectal cancer Neg Hx    Stomach cancer Neg Hx     Social History   Socioeconomic History   Marital status: Married    Spouse name: Not on file   Number of children: Not on file   Years of education: Not on file   Highest education level: Not on file  Occupational History   Not on file  Tobacco Use   Smoking status: Never   Smokeless tobacco: Not on file  Substance and Sexual Activity   Alcohol use: Yes    Comment: Social   Drug use: No   Sexual activity: Not on file  Other Topics Concern   Not on file  Social History Narrative   Not on file   Social Drivers of Health   Financial Resource Strain: Not on file  Food Insecurity: Not on file  Transportation Needs: Not on file  Physical Activity: Not on file  Stress: Not on file (10/12/2023)  Social Connections: Not on file  Intimate Partner Violence: Not on file    Review of Systems: Positive for none All other review of systems negative except as mentioned in the HPI.  Physical Exam: Vital signs in last 24 hours: Temp:  [97.6 F (36.4 C)] 97.6 F (36.4 C) (01/27 0739) Pulse Rate:  [79] 79 (01/27 0739) Resp:  [17] 17 (01/27 0739) BP: (137)/(92) 137/92 (01/27 0739) SpO2:  [96 %] 96 % (01/27 0739) Weight:  [65.8 kg] 65.8 kg (01/27 0739)   General:   Alert,  Well-developed, well-nourished, pleasant and cooperative in NAD Lungs:   Clear throughout to auscultation.   Heart:  Regular rate and rhythm; no murmurs, clicks, rubs,  or gallops. Abdomen:  Soft, nontender and nondistended. Normal bowel sounds.   Neuro/Psych:  Alert and cooperative. Normal mood and affect. A and O x 3    No significant changes were identified.  The patient continues to be an appropriate candidate for the planned procedure and anesthesia.   Edman Circle, MD. [facility] 01/01/2024 8:53 AM@

## 2024-01-02 LAB — SURGICAL PATHOLOGY

## 2024-01-02 NOTE — Anesthesia Postprocedure Evaluation (Signed)
Anesthesia Post Note  Patient: Sandra Harrison  Procedure(s) Performed: COLONOSCOPY WITH PROPOFOL POLYPECTOMY     Patient location during evaluation: PACU Anesthesia Type: MAC Level of consciousness: awake and alert Pain management: pain level controlled Vital Signs Assessment: post-procedure vital signs reviewed and stable Respiratory status: spontaneous breathing, nonlabored ventilation, respiratory function stable and patient connected to nasal cannula oxygen Cardiovascular status: stable and blood pressure returned to baseline Postop Assessment: no apparent nausea or vomiting Anesthetic complications: no   No notable events documented.  Last Vitals:  Vitals:   01/01/24 0945 01/01/24 0950  BP: (!) 130/92 132/88  Pulse: 73 65  Resp: 17 17  Temp:    SpO2: 98% 100%    Last Pain:  Vitals:   01/01/24 0950  TempSrc:   PainSc: 0-No pain   Pain Goal:                   Kennieth Rad

## 2024-01-03 ENCOUNTER — Encounter (HOSPITAL_COMMUNITY): Payer: Self-pay | Admitting: Gastroenterology

## 2024-01-05 ENCOUNTER — Encounter: Payer: Self-pay | Admitting: Gastroenterology

## 2024-09-23 ENCOUNTER — Other Ambulatory Visit: Payer: Self-pay | Admitting: Family Medicine

## 2024-09-23 DIAGNOSIS — Z1231 Encounter for screening mammogram for malignant neoplasm of breast: Secondary | ICD-10-CM

## 2024-10-21 ENCOUNTER — Ambulatory Visit
Admission: RE | Admit: 2024-10-21 | Discharge: 2024-10-21 | Disposition: A | Discharge status: Home / Self Care | Source: Ambulatory Visit | Attending: Family Medicine | Admitting: Family Medicine

## 2024-10-21 DIAGNOSIS — Z1231 Encounter for screening mammogram for malignant neoplasm of breast: Secondary | ICD-10-CM
